# Patient Record
Sex: Male | Born: 1945 | Race: White | Hispanic: No | Marital: Married | Smoking: Former smoker
Health system: Southern US, Community
[De-identification: ages and names within clinical notes are randomized; demographics above are authoritative.]

## PROBLEM LIST (undated history)

## (undated) DIAGNOSIS — F32A Depression, unspecified: Secondary | ICD-10-CM

## (undated) DIAGNOSIS — E785 Hyperlipidemia, unspecified: Secondary | ICD-10-CM

## (undated) DIAGNOSIS — F329 Major depressive disorder, single episode, unspecified: Secondary | ICD-10-CM

## (undated) HISTORY — PX: CATARACT EXTRACTION: SUR2

## (undated) HISTORY — DX: Depression, unspecified: F32.A

## (undated) HISTORY — DX: Major depressive disorder, single episode, unspecified: F32.9

## (undated) HISTORY — DX: Hyperlipidemia, unspecified: E78.5

## (undated) HISTORY — PX: CHOLECYSTECTOMY: SHX55

---

## 2002-08-16 LAB — HM COLONOSCOPY

## 2011-11-30 ENCOUNTER — Ambulatory Visit: Payer: Self-pay

## 2011-11-30 LAB — AMYLASE: Amylase: 33 U/L (ref 25–115)

## 2011-11-30 LAB — COMPREHENSIVE METABOLIC PANEL
Albumin: 3.9 g/dL (ref 3.4–5.0)
Anion Gap: 10 (ref 7–16)
Bilirubin,Total: 0.5 mg/dL (ref 0.2–1.0)
Chloride: 102 mmol/L (ref 98–107)
Co2: 28 mmol/L (ref 21–32)
Creatinine: 0.8 mg/dL (ref 0.60–1.30)
EGFR (African American): >60
Osmolality: 282 (ref 275–301)
Potassium: 3.9 mmol/L (ref 3.5–5.1)
SGOT(AST): 21 U/L (ref 15–37)
SGPT (ALT): 22 U/L
Sodium: 140 mmol/L (ref 136–145)
Total Protein: 7.4 g/dL (ref 6.4–8.2)

## 2011-11-30 LAB — CBC WITH DIFFERENTIAL/PLATELET
Basophil #: 0 10*3/uL (ref 0.0–0.1)
Eosinophil %: 0.3 %
HCT: 46.9 % (ref 40.0–52.0)
Lymphocyte #: 1.6 10*3/uL (ref 1.0–3.6)
Lymphocyte %: 13.7 %
MCH: 33.5 pg (ref 26.0–34.0)
MCHC: 34.4 g/dL (ref 32.0–36.0)
MCV: 97 fL (ref 80–100)
Neutrophil #: 9.5 10*3/uL — ABNORMAL HIGH (ref 1.4–6.5)
Neutrophil %: 79.4 %
Platelet: 189 10*3/uL (ref 150–440)
RDW: 13.2 % (ref 11.5–14.5)

## 2011-11-30 LAB — LIPASE, BLOOD: Lipase: 110 U/L (ref 73–393)

## 2011-12-01 ENCOUNTER — Ambulatory Visit: Payer: Self-pay

## 2011-12-08 ENCOUNTER — Ambulatory Visit: Payer: Self-pay | Admitting: General Surgery

## 2011-12-12 LAB — PATHOLOGY REPORT

## 2014-08-26 LAB — LIPID PANEL
Cholesterol: 233 mg/dL — AB (ref 0–200)
HDL: 45 mg/dL (ref 35–70)
LDL CALC: 157 mg/dL
Triglycerides: 155 mg/dL (ref 40–160)

## 2014-08-26 LAB — PSA: PSA: 5

## 2014-08-26 LAB — BASIC METABOLIC PANEL
BUN: 11 mg/dL (ref 4–21)
Creatinine: 0.9 mg/dL (ref <=1.3)

## 2014-08-26 LAB — HEMOGLOBIN A1C: Hgb A1c MFr Bld: 6.1 % — AB (ref 4.0–6.0)

## 2014-12-07 NOTE — Op Note (Signed)
PATIENT NAME:  Charles Snyder, Charles Snyder MR#:  [id number] DATE OF BIRTH:  12-04-1945  DATE OF PROCEDURE:  12/08/2011  PREOPERATIVE DIAGNOSIS: Chronic cholecystitis and cholelithiasis.   POSTOPERATIVE DIAGNOSIS: Chronic cholecystitis and cholelithiasis.   OPERATIVE PROCEDURE: Laparoscopic cholecystectomy with intraoperative cholangiograms.   SURGEON: Hervey Ard, MD  ANESTHESIA: General endotracheal under Dr. [place] Service.   ESTIMATED BLOOD LOSS: 5 mL.   CLINICAL NOTE: This 69 year old male had an episode of severe upper abdominal pain approximately one week ago. Ultrasound showed evidence of acute cholecystitis. He was asymptomatic at that time and he was scheduled for elective cholecystectomy.   OPERATIVE NOTE: With the patient under adequate general endotracheal anesthesia, the abdomen was prepped with ChloraPrep and draped. In Trendelenburg position, a Veress needle was placed through a transumbilical incision. After assuring intraabdominal location with the hanging drop test, the abdomen was insufflated with CO2 at 10 mmHg pressure. A 10 mm step port was expanded and inspection showed no evidence of injury from initial port placement.   The patient was placed in reverse Trendelenburg position and rolled to the left. An 11 mm Xcel port was placed in the epigastrium and two 5 mm step ports placed laterally. The adhesions of the omentum to the undersurface of the gallbladder and liver were taken down with cautery dissection. The gallbladder was placed on cephalad traction. There were moderate inflammatory changes and an enlarged lymph node in the portal triad. The cystic duct was cleared and fluoroscopic cholangiograms completed using 13 mL of one-half strength Conray 60. This showed prompt filling of the right and left hepatic ducts, free flow into the common bile duct and into the duodenum. No evidence of retained stones. The cystic duct and multiple branches of the cystic artery were clipped  adjacent to the gallbladder. It was removed from the liver bed making use of hook cautery dissection. The gallbladder was placed into a retrieval bag and brought out through the umbilical port site. After reestablishing pneumoperitoneum, inspection from the epigastric site again showed no evidence of injury from initial port placement. The right upper quadrant was irrigated with lactated Ringer's solution. The abdomen was then desufflated and ports removed under direct vision. The fascia at the umbilicus was closed with a single 0 Vicryl suture. Skin incisions were closed with 4-0 Vicryl subcuticular sutures. Benzoin, Steri-Strips, Telfa, and Tegaderm dressing was then  applied.   The patient tolerated the procedure well and was taken to the recovery room in stable condition. ____________________________ Robert Bellow, MD jwb:slb D: 12/08/2011 21:52:24 ET T: 12/09/2011 10:15:52 ET JOB#: [id number]  cc: Robert Bellow, MD, <Dictator> [name], MD Daxx Tiggs Amedeo Kinsman MD ELECTRONICALLY SIGNED 12/11/2011 13:42

## 2015-03-03 ENCOUNTER — Other Ambulatory Visit: Payer: Self-pay | Admitting: Internal Medicine

## 2015-03-03 ENCOUNTER — Encounter: Payer: Self-pay | Admitting: Internal Medicine

## 2015-03-03 DIAGNOSIS — E119 Type 2 diabetes mellitus without complications: Secondary | ICD-10-CM | POA: Insufficient documentation

## 2015-03-03 DIAGNOSIS — F17201 Nicotine dependence, unspecified, in remission: Secondary | ICD-10-CM | POA: Insufficient documentation

## 2015-03-03 DIAGNOSIS — G2581 Restless legs syndrome: Secondary | ICD-10-CM | POA: Insufficient documentation

## 2015-03-03 DIAGNOSIS — E785 Hyperlipidemia, unspecified: Secondary | ICD-10-CM | POA: Insufficient documentation

## 2015-03-03 DIAGNOSIS — Z86010 Personal history of colonic polyps: Secondary | ICD-10-CM | POA: Insufficient documentation

## 2015-06-15 ENCOUNTER — Ambulatory Visit (INDEPENDENT_AMBULATORY_CARE_PROVIDER_SITE_OTHER): Payer: Medicare HMO

## 2015-06-15 DIAGNOSIS — Z23 Encounter for immunization: Secondary | ICD-10-CM | POA: Diagnosis not present

## 2015-07-18 ENCOUNTER — Other Ambulatory Visit: Payer: Self-pay | Admitting: Internal Medicine

## 2015-09-09 ENCOUNTER — Other Ambulatory Visit: Payer: Self-pay | Admitting: Internal Medicine

## 2015-10-08 ENCOUNTER — Encounter: Payer: Self-pay | Admitting: Internal Medicine

## 2015-10-08 ENCOUNTER — Ambulatory Visit (INDEPENDENT_AMBULATORY_CARE_PROVIDER_SITE_OTHER): Payer: Medicare HMO | Admitting: Internal Medicine

## 2015-10-08 VITALS — BP 118/80 | HR 64 | Ht 74.0 in | Wt 213.2 lb

## 2015-10-08 DIAGNOSIS — R7303 Prediabetes: Secondary | ICD-10-CM

## 2015-10-08 DIAGNOSIS — Z720 Tobacco use: Secondary | ICD-10-CM | POA: Diagnosis not present

## 2015-10-08 DIAGNOSIS — G2581 Restless legs syndrome: Secondary | ICD-10-CM | POA: Diagnosis not present

## 2015-10-08 DIAGNOSIS — R972 Elevated prostate specific antigen [PSA]: Secondary | ICD-10-CM

## 2015-10-08 DIAGNOSIS — Z1211 Encounter for screening for malignant neoplasm of colon: Secondary | ICD-10-CM | POA: Diagnosis not present

## 2015-10-08 DIAGNOSIS — E785 Hyperlipidemia, unspecified: Secondary | ICD-10-CM | POA: Diagnosis not present

## 2015-10-08 DIAGNOSIS — F17201 Nicotine dependence, unspecified, in remission: Secondary | ICD-10-CM

## 2015-10-08 LAB — POCT URINALYSIS DIPSTICK
BILIRUBIN UA: NEGATIVE
GLUCOSE UA: NEGATIVE
Ketones, UA: NEGATIVE
LEUKOCYTES UA: NEGATIVE
NITRITE UA: NEGATIVE
Protein, UA: NEGATIVE
RBC UA: NEGATIVE
Spec Grav, UA: 1.01
UROBILINOGEN UA: 0.2
pH, UA: 6

## 2015-10-08 MED ORDER — CARBIDOPA-LEVODOPA 25-250 MG PO TABS: 5.0000 | ORAL_TABLET | Freq: Every day | ORAL | Status: DC

## 2015-10-08 NOTE — Patient Instructions (Signed)
Health Maintenance  Topic Date Due  . Hepatitis C Screening  18-May-1946  . TETANUS/TDAP  04/25/1967  . ZOSTAVAX  04/24/2008  . COLONOSCOPY  08/16/2012  . INFLUENZA VACCINE  03/15/2016  . PNA vac Low Risk Adult  Completed

## 2015-10-08 NOTE — Progress Notes (Signed)
Patient: Charles Snyder, Male    DOB: 1946/01/31, 70 y.o.   MRN: [id number] Visit Date: 10/08/2015  Today's Provider: [name], MD   Chief Complaint  Patient presents with  . Medicare Wellness  . Hyperlipidemia   Subjective:    Annual wellness visit Charles Snyder is a 70 y.o. male who presents today for his Subsequent Annual Wellness Visit. He feels fairly well. He reports exercising regularly. He reports he is sleeping fairly well.   ----------------------------------------------------------- Hyperlipidemia This is a chronic problem. The current episode started more than 1 year ago. Pertinent negatives include no chest pain, myalgias or shortness of breath. Current antihyperlipidemic treatment includes exercise and diet change (stopped lipitor due to worsening RLS). Improvement on treatment: he also quit smoking.  Restless leg - has been on sinemet for many years for severe symptoms.  They seem to be progressing lately.  Gabapentin is also used to help extend the effect but he has been reducing it recently. Elevated PSA - seen by Urology.  Biopsy was negative.  He was due to return for a PSA recheck several months ago but never got the call back. Lab Results  Component Value Date   PSA 5.0 08/26/2014    Review of Systems  Constitutional: Negative for chills, diaphoresis, appetite change, fatigue and unexpected weight change.  HENT: Negative for hearing loss, tinnitus, trouble swallowing and voice change.   Eyes: Negative for visual disturbance.  Respiratory: Negative for choking, shortness of breath and wheezing.   Cardiovascular: Negative for chest pain, palpitations and leg swelling.  Gastrointestinal: Negative for abdominal pain, diarrhea, constipation and blood in stool.  Genitourinary: Negative for dysuria, frequency, decreased urine volume and difficulty urinating.  Musculoskeletal: Positive for arthralgias. Negative for myalgias and back pain.  Skin: Negative  for color change and rash.  Neurological: Negative for dizziness, syncope, light-headedness and headaches.  Hematological: Negative for adenopathy.  Psychiatric/Behavioral: Negative for sleep disturbance and dysphoric mood.    Social History   Social History  . Marital Status: Married    Spouse Name: N/A  . Number of Children: N/A  . Years of Education: N/A   Occupational History  . Not on file.   Social History Main Topics  . Smoking status: Former Smoker    Quit date: 12/14/2014  . Smokeless tobacco: Not on file  . Alcohol Use: 0.0 oz/week    0 Standard drinks or equivalent per week     Comment: beer or wine occasionally  . Drug Use: No  . Sexual Activity: Not on file   Other Topics Concern  . Not on file   Social History Narrative    Patient Active Problem List   Diagnosis Date Noted  . Elevated PSA 10/08/2015  . Restless leg 03/03/2015  . Current tobacco use 03/03/2015  . Dyslipidemia 03/03/2015  . History of colon polyps 03/03/2015  . Pre-diabetes 03/03/2015    Past Surgical History  Procedure Laterality Date  . Cholecystectomy    . Cataracts      His family history includes Diabetes in his mother; Lung cancer in his father.    Previous Medications   ATORVASTATIN (LIPITOR) 20 MG TABLET    TAKE 1 TABLET BY MOUTH AT BEDTIME   CARBIDOPA-LEVODOPA (SINEMET IR) 25-250 MG TABLET    TAKE 5 TABLETS BY MOUTH DAILY   GABAPENTIN (NEURONTIN) 300 MG CAPSULE    TAKE 2-4 CAPSULES BY MOUTH EVERY NIGHT AT BEDTIME    Patient Care Team: Glean Hess,  MD as PCP - General (Family Medicine)     Objective:   Vitals: BP 118/80 mmHg  Pulse 64  Ht 6\' 2"  (1.88 m)  Wt 213 lb 3.2 oz (96.707 kg)  BMI 27.36 kg/m2  Physical Exam  Constitutional: He is oriented to person, place, and time. He appears well-developed and well-nourished.  HENT:  Head: Normocephalic.  Right Ear: Tympanic membrane, external ear and ear canal normal.  Left Ear: Tympanic membrane, external  ear and ear canal normal.  Nose: Nose normal.  Mouth/Throat: Uvula is midline and oropharynx is clear and moist.  Eyes: Conjunctivae and EOM are normal. Pupils are equal, round, and reactive to light.  Neck: Normal range of motion. Neck supple. Carotid bruit is not present. No thyromegaly present.  Cardiovascular: Normal rate, regular rhythm, normal heart sounds and intact distal pulses.   Pulmonary/Chest: Effort normal and breath sounds normal. He has no wheezes. Right breast exhibits no mass. Left breast exhibits no mass.  Abdominal: Soft. Normal appearance and bowel sounds are normal. There is no hepatosplenomegaly. There is no tenderness.  Musculoskeletal: Normal range of motion.  Lymphadenopathy:    He has no cervical adenopathy.  Neurological: He is alert and oriented to person, place, and time. He has normal reflexes.  Skin: Skin is warm, dry and intact.  Psychiatric: He has a normal mood and affect. His speech is normal and behavior is normal. Judgment and thought content normal.  Nursing note and vitals reviewed.   Activities of Daily Living In your present state of health, do you have any difficulty performing the following activities: 10/08/2015  Hearing? N  Vision? N  Difficulty concentrating or making decisions? N  Walking or climbing stairs? N  Dressing or bathing? N  Doing errands, shopping? N    Fall Risk Assessment Fall Risk  10/08/2015  Falls in the past year? No      Depression Screen PHQ 2/9 Scores 10/08/2015  PHQ - 2 Score 0    Cognitive Testing - 6-CIT   Correct? Score   What year is it? yes 0 Yes = 0    No = 4  What month is it? yes 0 Yes = 0    No = 3  Remember:     [address]ia [address], [place], [place]     What time is it? yes 0 Yes = 0    No = 3  Count backwards from 20 to 1 yes 0 Correct = 0    1 error = 2   More than 1 error = 4  Say the months of the year in reverse. yes 0 Correct = 0    1 error = 2   More than 1 error = 4  What address did I ask  you to remember? yes 0 Correct = 0  1 error = 2    2 error = 4    3 error = 6    4 error = 8    All wrong = 10       TOTAL SCORE  0/28   Interpretation:  Normal  Normal (0-7) Abnormal (8-28)     Medicare Annual Wellness Visit Summary:  Reviewed patient's Family Medical History Reviewed and updated list of patient's medical providers Assessment of cognitive impairment was done Assessed patient's functional ability Established a written schedule for health screening [place] Completed and Reviewed  Exercise Activities and Dietary recommendations Goals    None  Immunization History  Administered Date(s) Administered  . Influenza,inj,Quad PF,36+ Mos 06/15/2015  . Pneumococcal Conjugate-13 08/26/2014  . Pneumococcal Polysaccharide-23 05/01/2013    Health Maintenance  Topic Date Due  . Hepatitis C Screening  03-18-1946  . TETANUS/TDAP  04/25/1967  . ZOSTAVAX  04/24/2008  . COLONOSCOPY  08/16/2012  . INFLUENZA VACCINE  03/15/2016  . PNA vac Low Risk Adult  Completed     Discussed health benefits of physical activity, and encouraged him to engage in regular exercise appropriate for his age and condition.    ------------------------------------------------------------------------------------------------------------   Assessment & Plan:  1. Dyslipidemia Now off of statins but has quit smoking - Lipid panel  2. Pre-diabetes Continue exercise and diet - Comprehensive metabolic panel - Hemoglobin A1c - POCT Urinalysis Dipstick  3. Restless leg Take gabapentin regularly May need Neurology re-evaluation - CBC with Differential/Platelet - TSH - carbidopa-levodopa (SINEMET IR) 25-250 MG tablet; Take 5 tablets by mouth daily.  Dispense: 150 tablet; Refill: 5  4. Tobacco use disorder, moderate, in sustained remission Pt congratulated on quitting  5. Elevated PSA Will recheck today - PSA  6. Colon cancer screening Refer for 10 yr follow  up. - Ambulatory referral to Gastroenterology   [name], MD [facility]  10/08/2015

## 2015-10-09 ENCOUNTER — Telehealth: Payer: Self-pay

## 2015-10-09 LAB — COMPREHENSIVE METABOLIC PANEL
A/G RATIO: 1.6 (ref 1.1–2.5)
ALK PHOS: 89 IU/L (ref 39–117)
ALT: 32 IU/L (ref 0–44)
AST: 34 IU/L (ref 0–40)
Albumin: 4.4 g/dL (ref 3.6–4.8)
BUN/Creatinine Ratio: 14 (ref 10–22)
BUN: 13 mg/dL (ref 8–27)
Bilirubin Total: 0.6 mg/dL (ref 0.0–1.2)
CO2: 25 mmol/L (ref 18–29)
CREATININE: 0.9 mg/dL (ref 0.76–1.27)
Calcium: 9.7 mg/dL (ref 8.6–10.2)
Chloride: 100 mmol/L (ref 96–106)
GFR calc Af Amer: 102 mL/min/{1.73_m2} (ref >59)
GFR calc non Af Amer: 88 mL/min/{1.73_m2} (ref >59)
Globulin, Total: 2.8 g/dL (ref 1.5–4.5)
Glucose: 101 mg/dL — ABNORMAL HIGH (ref 65–99)
POTASSIUM: 4.4 mmol/L (ref 3.5–5.2)
Sodium: 139 mmol/L (ref 134–144)
Total Protein: 7.2 g/dL (ref 6.0–8.5)

## 2015-10-09 LAB — CBC WITH DIFFERENTIAL/PLATELET
Basophils Absolute: 0 10*3/uL (ref 0.0–0.2)
Basos: 0 %
EOS (ABSOLUTE): 0.1 10*3/uL (ref 0.0–0.4)
Eos: 2 %
Hematocrit: 46.9 % (ref 37.5–51.0)
Hemoglobin: 15.8 g/dL (ref 12.6–17.7)
Immature Grans (Abs): 0 10*3/uL (ref 0.0–0.1)
Immature Granulocytes: 0 %
Lymphocytes Absolute: 1.9 10*3/uL (ref 0.7–3.1)
Lymphs: 36 %
MCH: 32.1 pg (ref 26.6–33.0)
MCHC: 33.7 g/dL (ref 31.5–35.7)
MCV: 95 fL (ref 79–97)
MONOS ABS: 0.6 10*3/uL (ref 0.1–0.9)
Monocytes: 12 %
NEUTROS PCT: 50 %
Neutrophils Absolute: 2.7 10*3/uL (ref 1.4–7.0)
PLATELETS: 221 10*3/uL (ref 150–379)
RBC: 4.92 x10E6/uL (ref 4.14–5.80)
RDW: 13.8 % (ref 12.3–15.4)
WBC: 5.3 10*3/uL (ref 3.4–10.8)

## 2015-10-09 LAB — LIPID PANEL
CHOLESTEROL TOTAL: 229 mg/dL — AB (ref 100–199)
Chol/HDL Ratio: 5.1 ratio units — ABNORMAL HIGH (ref 0.0–5.0)
HDL: 45 mg/dL (ref >39)
LDL Calculated: 155 mg/dL — ABNORMAL HIGH (ref 0–99)
TRIGLYCERIDES: 144 mg/dL (ref 0–149)
VLDL Cholesterol Cal: 29 mg/dL (ref 5–40)

## 2015-10-09 LAB — TSH: TSH: 1.17 u[IU]/mL (ref 0.450–4.500)

## 2015-10-09 LAB — HEMOGLOBIN A1C
ESTIMATED AVERAGE GLUCOSE: 137 mg/dL
Hgb A1c MFr Bld: 6.4 % — ABNORMAL HIGH (ref 4.8–5.6)

## 2015-10-09 LAB — PSA: Prostate Specific Ag, Serum: 5 ng/mL — ABNORMAL HIGH (ref 0.0–4.0)

## 2015-10-09 NOTE — Telephone Encounter (Signed)
Spoke with patient. Patient advised of all results and verbalized understanding. Will call back with any future questions or concerns. MAH  

## 2015-10-09 NOTE — Telephone Encounter (Signed)
-----   Message from Glean Hess, MD sent at 10/09/2015  7:54 AM EST ----- PSA is same at 5.0.  Pre-diabetes is slightly worse - work on diet and weight loss.  Need to recheck with OV and lab in 6 months.  All other labs are good.  Cholesterol is unchanged.

## 2015-11-02 ENCOUNTER — Telehealth: Payer: Self-pay | Admitting: Gastroenterology

## 2015-11-02 NOTE — Telephone Encounter (Signed)
Responding to colonoscopy letter

## 2015-11-05 NOTE — Telephone Encounter (Signed)
LVM for pt to return my call.

## 2015-11-24 ENCOUNTER — Telehealth: Payer: Self-pay | Admitting: Gastroenterology

## 2015-11-24 NOTE — Telephone Encounter (Signed)
Please call for colonoscopy screening. There is a referral from Dr Army Melia and I looked in his chart and Ginger attempted to call patient for triage in March and also mailed a letter. Patient called on 11/24/15 ready to schedule colonoscopy.

## 2015-12-07 ENCOUNTER — Encounter: Payer: Self-pay | Admitting: Internal Medicine

## 2015-12-07 ENCOUNTER — Ambulatory Visit
Admission: RE | Admit: 2015-12-07 | Discharge: 2015-12-07 | Disposition: A | Discharge status: Home / Self Care | Payer: Medicare HMO | Source: Ambulatory Visit | Attending: Internal Medicine | Admitting: Internal Medicine

## 2015-12-07 ENCOUNTER — Ambulatory Visit (INDEPENDENT_AMBULATORY_CARE_PROVIDER_SITE_OTHER): Payer: Medicare HMO | Admitting: Internal Medicine

## 2015-12-07 VITALS — BP 132/84 | HR 68 | Ht 74.0 in | Wt 216.0 lb

## 2015-12-07 DIAGNOSIS — I700 Atherosclerosis of aorta: Secondary | ICD-10-CM | POA: Insufficient documentation

## 2015-12-07 DIAGNOSIS — R251 Tremor, unspecified: Secondary | ICD-10-CM | POA: Diagnosis not present

## 2015-12-07 DIAGNOSIS — R202 Paresthesia of skin: Secondary | ICD-10-CM | POA: Insufficient documentation

## 2015-12-07 DIAGNOSIS — G2581 Restless legs syndrome: Secondary | ICD-10-CM

## 2015-12-07 DIAGNOSIS — E119 Type 2 diabetes mellitus without complications: Secondary | ICD-10-CM | POA: Diagnosis not present

## 2015-12-07 DIAGNOSIS — Z9189 Other specified personal risk factors, not elsewhere classified: Secondary | ICD-10-CM

## 2015-12-07 DIAGNOSIS — Z77098 Contact with and (suspected) exposure to other hazardous, chiefly nonmedicinal, chemicals: Secondary | ICD-10-CM

## 2015-12-07 NOTE — Progress Notes (Signed)
Date:  12/07/2015   Name:  Charles Snyder   DOB:  1945-11-25   MRN:  [id number]   Chief Complaint: Follow-up and Diabetes Diabetes He presents for his follow-up diabetic visit. He has type 2 diabetes mellitus. His disease course has been stable. Hypoglycemia symptoms include tremors. Pertinent negatives for hypoglycemia include no dizziness or headaches. Pertinent negatives for diabetes include no chest pain, no fatigue and no weakness.  Last A1C = 6.4.   He is following a good diet and controlling his weight.  We have not started medication yet.  RLS - onset many years ago.  Now having more trouble with fine motor movements in hands/arms and feels like his muscles are twitching or he has a  tremor.   Stiffness in neck and arms -  Worsening over the past year. Having more trouble with neck movements.  No weakness in arms.  Not taking any medication or using heat or ice.  He feels like there is a tremor. Since he takes Sinemet for RLS he wonders if parkinson symptoms are being masked.  Tingling/numbness in feet - off and on for several years and now increasing.  Persistent and worse with prolonged walking or standing.  He denies change in bowel or bladder function.  He denies leg weakness. He has never had evaluation of his lower back for DDD.   He reports being exposed to [place] in [place].  He never thought much about possible medical complications until his recent DM and worsening neurological symptoms. He is investigating what his options are with the [place].  Review of Systems  Constitutional: Negative for fever, chills, fatigue and unexpected weight change.  HENT: Negative for tinnitus and trouble swallowing.   Eyes: Negative for visual disturbance.  Respiratory: Negative for cough, chest tightness, shortness of breath and wheezing.   Cardiovascular: Negative for chest pain, palpitations and leg swelling.  Gastrointestinal: Negative for abdominal pain.  Genitourinary: Negative  for dysuria.  Musculoskeletal: Positive for myalgias, arthralgias and neck stiffness. Negative for gait problem and neck pain.  Skin: Negative for rash.  Neurological: Positive for tremors. Negative for dizziness, syncope, weakness and headaches.  Psychiatric/Behavioral: Positive for sleep disturbance (and vivid dreams). Negative for dysphoric mood.    Patient Active Problem List   Diagnosis Date Noted  . Elevated PSA 10/08/2015  . Restless leg 03/03/2015  . Tobacco use disorder, moderate, in sustained remission 03/03/2015  . Dyslipidemia 03/03/2015  . History of colon polyps 03/03/2015  . Pre-diabetes 03/03/2015    Prior to Admission medications   Medication Sig Start Date End Date Taking? Authorizing Provider  carbidopa-levodopa (SINEMET IR) 25-250 MG tablet Take 5 tablets by mouth daily. 10/08/15  Yes Glean Hess, MD  gabapentin (NEURONTIN) 300 MG capsule TAKE 2-4 CAPSULES BY MOUTH EVERY NIGHT AT BEDTIME 09/09/15  Yes Glean Hess, MD  atorvastatin (LIPITOR) 20 MG tablet TAKE 1 TABLET BY MOUTH AT BEDTIME Patient not taking: Reported on 12/07/2015 03/03/15   Glean Hess, MD    No Known Allergies  Past Surgical History  Procedure Laterality Date  . Cholecystectomy    . Cataract extraction Bilateral     Social History  Substance Use Topics  . Smoking status: Former Smoker    Quit date: 12/14/2014  . Smokeless tobacco: None  . Alcohol Use: 0.0 oz/week    0 Standard drinks or equivalent per week     Comment: beer or wine occasionally     Medication list has  been reviewed and updated.   Physical Exam  Constitutional: He is oriented to person, place, and time. He appears well-developed and well-nourished. No distress.  HENT:  Head: Normocephalic and atraumatic.  Neck: Muscular tenderness present. Carotid bruit is not present. Decreased range of motion present.  Cardiovascular: Normal rate, regular rhythm and normal heart sounds.   Pulmonary/Chest: Effort  normal and breath sounds normal. No respiratory distress. He has no wheezes. He has no rhonchi.  Musculoskeletal:       Cervical back: He exhibits decreased range of motion and bony tenderness. He exhibits no edema and no spasm.       Lumbar back: He exhibits no tenderness and no spasm.  Neurological: He is alert and oriented to person, place, and time. He displays no tremor. He exhibits abnormal muscle tone (increased tone in UE to passive ROM - unsure how much is due to inability to completely relax during the exam). Coordination and gait normal.  Reflex Scores:      Bicep reflexes are 2+ on the right side and 2+ on the left side.      Patellar reflexes are 1+ on the right side and 1+ on the left side. Skin: Skin is warm and dry. No rash noted.  Psychiatric: He has a normal mood and affect. His speech is normal and behavior is normal. Thought content normal.    BP 132/84 mmHg  Pulse 68  Ht 6\' 2"  (1.88 m)  Wt 216 lb (97.977 kg)  BMI 27.72 kg/m2  Assessment and Plan: 1. Type 2 diabetes mellitus without complication, without long-term current use of insulin (HCC) Now DM range A1C 6.4 Continue diet and exercise - recheck in 2 months to determine need for medication  2. Restless leg Continue [place] - Ambulatory referral to Neurology  3. 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 Paresthesia of both feet Rule out lumbar DDD Consider further testing if needed - DG Lumbar Spine Complete; Future - Ambulatory referral to Neurology  5. History of agent Orange exposure DM and neurological issues may be related - patient is determining next course of action.  [name], MD [facility]  12/07/2015

## 2015-12-08 ENCOUNTER — Telehealth: Payer: Self-pay

## 2015-12-08 NOTE — Telephone Encounter (Signed)
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  No acute abnormality and no obvious cause of nerve compression to cause foot symptoms.  Will wait for Neurology evaluation then consider further testing of the lumbar spine if needed.

## 2015-12-08 NOTE — Telephone Encounter (Signed)
Spoke with patient. Patient advised of all results and verbalized understanding. Will call back with any future questions or concerns. MAH  

## 2015-12-30 NOTE — Telephone Encounter (Signed)
LVM again for pt to return my call.  

## 2016-02-08 ENCOUNTER — Encounter: Payer: Self-pay | Admitting: Internal Medicine

## 2016-02-08 ENCOUNTER — Ambulatory Visit (INDEPENDENT_AMBULATORY_CARE_PROVIDER_SITE_OTHER): Payer: Medicare HMO | Admitting: Internal Medicine

## 2016-02-08 VITALS — BP 120/88 | HR 86 | Ht 70.0 in | Wt 210.0 lb

## 2016-02-08 DIAGNOSIS — E119 Type 2 diabetes mellitus without complications: Secondary | ICD-10-CM

## 2016-02-08 DIAGNOSIS — E785 Hyperlipidemia, unspecified: Secondary | ICD-10-CM

## 2016-02-08 DIAGNOSIS — G2581 Restless legs syndrome: Secondary | ICD-10-CM | POA: Diagnosis not present

## 2016-02-08 MED ORDER — PRAVASTATIN SODIUM 20 MG PO TABS: 20.0000 mg | ORAL_TABLET | Freq: Every day | ORAL | Status: AC

## 2016-02-08 MED ORDER — GABAPENTIN 300 MG PO CAPS: ORAL_CAPSULE | ORAL | Status: DC

## 2016-02-08 MED ORDER — CARBIDOPA-LEVODOPA 25-250 MG PO TABS: 5.0000 | ORAL_TABLET | Freq: Every day | ORAL | Status: DC

## 2016-02-08 NOTE — Progress Notes (Signed)
Date:  02/08/2016   Name:  Charles Snyder   DOB:  October 19, 1945   MRN:  [id number]   Chief Complaint: Diabetes Diabetes He presents for his follow-up diabetic visit. He has type 2 diabetes mellitus. His disease course has been stable (A1C slightly worse). Pertinent negatives for hypoglycemia include no headaches, mood changes or sweats. Pertinent negatives for diabetes include no blurred vision, no chest pain, no fatigue, no foot ulcerations and no polyuria. Symptoms are stable.  Hyperlipidemia This is a chronic problem. The current episode started more than 1 year ago. The problem is uncontrolled (could not tolerate lipitor due to muscle cramps). Exacerbating diseases include diabetes. Pertinent negatives include no chest pain or shortness of breath. Compliance problems include medication side effects.   Restless leg - recently seen by Neurology and placed on Celexa 40 mg.  He has no side effects but also has not noticed any improvement in his neurological symptoms.  He also had a NCS/EMG.  Lab Results  Component Value Date   HGBA1C 6.4* 10/08/2015     Review of Systems  Constitutional: Negative for fever, chills and fatigue.  Eyes: Negative for blurred vision.  Respiratory: Negative for cough, chest tightness and shortness of breath.   Cardiovascular: Negative for chest pain, palpitations and leg swelling.  Endocrine: Negative for polyuria.  Musculoskeletal: Negative for arthralgias.  Neurological: Positive for numbness (and muscle twitching). Negative for headaches.  Hematological: Negative for adenopathy.  Psychiatric/Behavioral: Negative for dysphoric mood.    Patient Active Problem List   Diagnosis Date Noted  . History of agent Orange exposure 12/07/2015  . Tremor of both hands 12/07/2015  . Paresthesia of both feet 12/07/2015  . Elevated PSA 10/08/2015  . Restless leg 03/03/2015  . Tobacco use disorder, moderate, in sustained remission 03/03/2015  . Dyslipidemia  03/03/2015  . History of colon polyps 03/03/2015  . Diabetes ([place]) 03/03/2015    Prior to Admission medications   Medication Sig Start Date End Date Taking? Authorizing Provider  citalopram (CELEXA) 40 MG tablet Take 1 tablet by mouth daily. 12/31/15  Yes Historical Provider, MD  atorvastatin (LIPITOR) 20 MG tablet TAKE 1 TABLET BY MOUTH AT BEDTIME Patient not taking: Reported on 12/07/2015 03/03/15   Glean Hess, MD  carbidopa-levodopa (SINEMET IR) 25-250 MG tablet Take 5 tablets by mouth daily. 10/08/15   Glean Hess, MD  gabapentin (NEURONTIN) 300 MG capsule TAKE 2-4 CAPSULES BY MOUTH EVERY NIGHT AT BEDTIME 09/09/15   Glean Hess, MD    No Known Allergies  Past Surgical History  Procedure Laterality Date  . Cholecystectomy    . Cataract extraction Bilateral     Social History  Substance Use Topics  . Smoking status: Former Smoker    Quit date: 12/14/2014  . Smokeless tobacco: None  . Alcohol Use: 0.0 oz/week    0 Standard drinks or equivalent per week     Comment: beer or wine occasionally     Medication list has been reviewed and updated.   Physical Exam  Constitutional: He is oriented to person, place, and time. He appears well-developed. No distress.  HENT:  Head: Normocephalic and atraumatic.  Neck: Carotid bruit is not present.  Cardiovascular: Normal rate, regular rhythm and normal heart sounds.   Pulmonary/Chest: Effort normal and breath sounds normal. No respiratory distress.  Musculoskeletal: Normal range of motion.  Neurological: He is alert and oriented to person, place, and time.  Normal foot exam bilaterally  Skin: Skin is  warm and dry. No rash noted.  Psychiatric: He has a normal mood and affect. His speech is normal and behavior is normal. Thought content normal.  Nursing note and vitals reviewed.   BP 120/88 mmHg  Pulse 86  Ht 5\' 10"  (1.778 m)  Wt 210 lb (95.255 kg)  BMI 30.13 kg/m2  Assessment and Plan: 1. Type 2 diabetes  mellitus without complication, without long-term current use of insulin (HCC) Continue diet and exercise - Hemoglobin A1c  2. Dyslipidemia Will try Pravachol - pravastatin (PRAVACHOL) 20 MG tablet; Take 1 tablet (20 mg total) by mouth daily.  Dispense: 90 tablet; Refill: 3  3. Restless leg Seen by Neurology Continue celexa - gabapentin (NEURONTIN) 300 MG capsule; TAKE 2-4 CAPSULES BY MOUTH EVERY NIGHT AT BEDTIME  Dispense: 100 capsule; Refill: 5 - carbidopa-levodopa (SINEMET IR) 25-250 MG tablet; Take 5 tablets by mouth daily.  Dispense: 150 tablet; Refill: [place], MD [facility]  02/08/2016

## 2016-02-09 LAB — HEMOGLOBIN A1C
Est. average glucose Bld gHb Est-mCnc: 131 mg/dL
Hgb A1c MFr Bld: 6.2 % — ABNORMAL HIGH (ref 4.8–5.6)

## 2016-03-03 ENCOUNTER — Other Ambulatory Visit: Payer: Self-pay | Admitting: Internal Medicine

## 2016-06-09 ENCOUNTER — Ambulatory Visit: Payer: Medicare HMO | Admitting: Internal Medicine

## 2016-06-10 ENCOUNTER — Other Ambulatory Visit: Payer: Self-pay | Admitting: Internal Medicine

## 2016-06-10 ENCOUNTER — Encounter (INDEPENDENT_AMBULATORY_CARE_PROVIDER_SITE_OTHER): Payer: Self-pay

## 2016-06-10 ENCOUNTER — Ambulatory Visit (INDEPENDENT_AMBULATORY_CARE_PROVIDER_SITE_OTHER): Payer: Medicare HMO | Admitting: Internal Medicine

## 2016-06-10 ENCOUNTER — Encounter: Payer: Self-pay | Admitting: Internal Medicine

## 2016-06-10 VITALS — BP 102/72 | HR 72 | Resp 16 | Ht 70.0 in | Wt 207.0 lb

## 2016-06-10 DIAGNOSIS — Z23 Encounter for immunization: Secondary | ICD-10-CM

## 2016-06-10 DIAGNOSIS — G2581 Restless legs syndrome: Secondary | ICD-10-CM

## 2016-06-10 DIAGNOSIS — R202 Paresthesia of skin: Secondary | ICD-10-CM | POA: Diagnosis not present

## 2016-06-10 DIAGNOSIS — E119 Type 2 diabetes mellitus without complications: Secondary | ICD-10-CM

## 2016-06-10 MED ORDER — CARBIDOPA-LEVODOPA 25-250 MG PO TABS: 5.0000 | ORAL_TABLET | Freq: Every day | ORAL | 5 refills | Status: DC

## 2016-06-10 NOTE — Progress Notes (Signed)
Date:  06/10/2016   Name:  Charles Snyder   DOB:  Feb 26, 1946   MRN:  [id number]   Chief Complaint: Diabetes (4 mo  fu) Diabetes  He presents for his follow-up diabetic visit. He has type 2 diabetes mellitus. Pertinent negatives for hypoglycemia include no headaches or tremors. Associated symptoms include foot paresthesias. Pertinent negatives for diabetes include no blurred vision, no chest pain, no fatigue, no foot ulcerations, no polydipsia, no polyuria and no weight loss. There are no hypoglycemic complications. Symptoms are stable. Current diabetic treatment includes diet. He is compliant with treatment most of the time.   Restless leg syndrome - on Sinemet 5 tabs spaced throughout the evening.  Symptoms are more prolonged now - even lasting into the morning.  He does not have sx during the day in general.  Neuropathy - pins and needles in feet seem to be getting worse.  He is already taking gabapentin for RLS. He denies any foot lesions or blisters.  Lightheadedness - occurs especially after a long round of golf.  He feels better after a snack and rest but still 'washed out' for the rest of the day.  He consumes a diet coke and a 16 oz bottle of water only while golfing for 4 hours.  Lab Results  Component Value Date   HGBA1C 6.2 (H) 02/08/2016     Review of Systems  Constitutional: Negative for appetite change, fatigue, unexpected weight change and weight loss.  HENT: Positive for hearing loss.   Eyes: Negative for blurred vision and visual disturbance.  Respiratory: Negative for cough, shortness of breath and wheezing.   Cardiovascular: Negative for chest pain, palpitations and leg swelling.  Gastrointestinal: Negative for abdominal pain and blood in stool.  Endocrine: Negative for polydipsia and polyuria.  Genitourinary: Negative for dysuria and hematuria.  Skin: Negative for color change and rash.  Neurological: Positive for light-headedness (after playing golf or other  exertion) and numbness. Negative for tremors, syncope and headaches.  Hematological: Negative for adenopathy.  Psychiatric/Behavioral: Negative for dysphoric mood and sleep disturbance.    Patient Active Problem List   Diagnosis Date Noted  . History of agent Orange exposure 12/07/2015  . Tremor of both hands 12/07/2015  . Paresthesia of both feet 12/07/2015  . Elevated PSA 10/08/2015  . Restless leg 03/03/2015  . Tobacco use disorder, moderate, in sustained remission 03/03/2015  . Dyslipidemia 03/03/2015  . History of colon polyps 03/03/2015  . Diabetes ([place]) 03/03/2015    Prior to Admission medications   Medication Sig Start Date End Date Taking? Authorizing Provider  carbidopa-levodopa (SINEMET IR) 25-250 MG tablet Take 5 tablets by mouth daily. 02/08/16  Yes Glean Hess, MD  gabapentin (NEURONTIN) 300 MG capsule TAKE 2-4 CAPSULES BY MOUTH EVERY NIGHT AT BEDTIME 02/08/16  Yes Glean Hess, MD  pravastatin (PRAVACHOL) 20 MG tablet Take 1 tablet (20 mg total) by mouth daily. 02/08/16  Yes Glean Hess, MD    No Known Allergies  Past Surgical History:  Procedure Laterality Date  . CATARACT EXTRACTION Bilateral   . CHOLECYSTECTOMY      Social History  Substance Use Topics  . Smoking status: Former Smoker    Quit date: 12/14/2014  . Smokeless tobacco: Never Used  . Alcohol use 0.0 oz/week     Comment: beer or wine occasionally     Medication list has been reviewed and updated.   Physical Exam  Constitutional: He is oriented to person, place, and time.  He appears well-developed and well-nourished. No distress.  HENT:  Head: Normocephalic and atraumatic.  Neck: Normal range of motion. Neck supple. Carotid bruit is not present.  Cardiovascular: Normal rate, regular rhythm and normal heart sounds.   Pulmonary/Chest: Effort normal and breath sounds normal. No respiratory distress.  Musculoskeletal: Normal range of motion.  Neurological: He is alert and oriented  to person, place, and time. He has normal reflexes.  Skin: Skin is warm and dry. No rash noted.  Psychiatric: He has a normal mood and affect. His behavior is normal. Thought content normal.  Nursing note and vitals reviewed.   BP 122/82   Pulse 72   Resp 16   Ht 5\' 10"  (1.778 m)   Wt 207 lb (93.9 kg)   SpO2 97%   BMI 29.70 kg/m   Assessment and Plan: 1. Type 2 diabetes mellitus without complication, without long-term current use of insulin (HCC) Diet controlled Increase fluid intake - Microalbumin / creatinine urine ratio - Hemoglobin A1c  2. Restless leg Continue current therapy - carbidopa-levodopa (SINEMET IR) 25-250 MG tablet; Take 5 tablets by mouth daily.  Dispense: 150 tablet; Refill: 5  3. Paresthesia of both feet Try Lyrica in place of gabapentin 75 mg twice a day (5 PM and 10 PM)  4. Need for influenza vaccination - Flu Vaccine QUAD 36+ mos IM   [name], MD [facility]  06/10/2016

## 2016-06-10 NOTE — Patient Instructions (Signed)
Lyrica 75 mg - take one at 5 PM and one an hour before bedtime.

## 2016-06-11 LAB — MICROALBUMIN / CREATININE URINE RATIO
Creatinine, Urine: 67.7 mg/dL
Microalbumin, Urine: <3 ug/mL

## 2016-06-28 LAB — HEMOGLOBIN A1C
Est. average glucose Bld gHb Est-mCnc: 134 mg/dL
Hgb A1c MFr Bld: 6.3 % — ABNORMAL HIGH (ref 4.8–5.6)

## 2016-10-03 ENCOUNTER — Other Ambulatory Visit: Payer: Self-pay

## 2016-10-03 DIAGNOSIS — G2581 Restless legs syndrome: Secondary | ICD-10-CM

## 2016-10-03 MED ORDER — GABAPENTIN 300 MG PO CAPS: ORAL_CAPSULE | ORAL | 0 refills | Status: AC

## 2016-10-03 MED ORDER — CARBIDOPA-LEVODOPA 25-250 MG PO TABS: 5.0000 | ORAL_TABLET | Freq: Every day | ORAL | 0 refills | Status: AC

## 2016-10-11 ENCOUNTER — Encounter: Payer: Self-pay | Admitting: Internal Medicine

## 2016-10-11 ENCOUNTER — Other Ambulatory Visit: Payer: Self-pay | Admitting: Internal Medicine

## 2016-10-11 ENCOUNTER — Ambulatory Visit (INDEPENDENT_AMBULATORY_CARE_PROVIDER_SITE_OTHER): Payer: Medicare Other | Admitting: Internal Medicine

## 2016-10-11 VITALS — BP 122/62 | HR 66 | Ht 70.0 in | Wt 223.0 lb

## 2016-10-11 DIAGNOSIS — Z0000 Encounter for general adult medical examination without abnormal findings: Secondary | ICD-10-CM | POA: Diagnosis not present

## 2016-10-11 DIAGNOSIS — Z23 Encounter for immunization: Secondary | ICD-10-CM | POA: Diagnosis not present

## 2016-10-11 DIAGNOSIS — E119 Type 2 diabetes mellitus without complications: Secondary | ICD-10-CM

## 2016-10-11 DIAGNOSIS — R972 Elevated prostate specific antigen [PSA]: Secondary | ICD-10-CM | POA: Diagnosis not present

## 2016-10-11 DIAGNOSIS — G2581 Restless legs syndrome: Secondary | ICD-10-CM | POA: Diagnosis not present

## 2016-10-11 DIAGNOSIS — E785 Hyperlipidemia, unspecified: Secondary | ICD-10-CM | POA: Diagnosis not present

## 2016-10-11 LAB — POCT URINALYSIS DIPSTICK
BILIRUBIN UA: NEGATIVE
GLUCOSE UA: NEGATIVE
KETONES UA: NEGATIVE
Leukocytes, UA: NEGATIVE
Nitrite, UA: NEGATIVE
Protein, UA: NEGATIVE
RBC UA: NEGATIVE
Spec Grav, UA: <=1.005
Urobilinogen, UA: 0.2
pH, UA: 6.5

## 2016-10-11 NOTE — Patient Instructions (Signed)
Health Maintenance  Topic Date Due  . TETANUS/TDAP  10/11/2026  . COLONOSCOPY  08/16/2012  . OPHTHALMOLOGY EXAM  04/15/2016  . HEMOGLOBIN A1C  12/25/2016  . FOOT EXAM  02/07/2017  . URINE MICROALBUMIN  06/10/2017  . INFLUENZA VACCINE  Completed  . Hepatitis C Screening  Addressed  . PNA vac Low Risk Adult  Completed

## 2016-10-11 NOTE — Progress Notes (Signed)
Patient: Charles Snyder, Male    DOB: Nov 08, 1945, 71 y.o.   MRN: [id number] Visit Date: 10/11/2016  Today's Provider: [name], MD   Chief Complaint  Patient presents with  . Annual Exam   Subjective:    Annual wellness visit Charles Snyder is a 71 y.o. male who presents today for his Subsequent Annual Wellness Visit. He feels well. He reports exercising regularly. He reports he is sleeping fairly well.  [facility] has now called him 70% disabled.  He and his wife are leaving soon to travel the [place] in a motor-home/camper.  They plan to eventually end up on the [facility] for a few years then return here. ----------------------------------------------------------- He does not check his blood sugar. He does not take medications for diabetes, just controls this with diet. However on review he has gained about 16 pounds and needs to work harder on diet. He is aware of this and the need for medications if A1c is much higher.   Hyperlipidemia  This is a chronic problem. The problem is controlled. Recent lipid tests were reviewed and are normal. Pertinent negatives include no chest pain, myalgias or shortness of breath. Current antihyperlipidemic treatment includes statins.  Diabetes  He presents for his follow-up diabetic visit. He has type 2 diabetes mellitus. Pertinent negatives for hypoglycemia include no dizziness or headaches. Pertinent negatives for diabetes include no chest pain and no fatigue.  Restless leg - unchanged, still disrupts sleep and evening acitivties.  He was seen by neurology but did not get NCS due to extreme nerve sensitivity.  He continues on gabapentin and sinemet.  Review of Systems  Constitutional: Negative for appetite change, chills, diaphoresis, fatigue and unexpected weight change.  HENT: Negative for hearing loss, tinnitus, trouble swallowing and voice change.   Eyes: Negative for visual disturbance.  Respiratory: Negative for choking, shortness of breath and  wheezing.   Cardiovascular: Negative for chest pain, palpitations and leg swelling.  Gastrointestinal: Negative for abdominal pain, blood in stool, constipation and diarrhea.  Genitourinary: Negative for difficulty urinating, dysuria and frequency.  Musculoskeletal: Negative for arthralgias, back pain and myalgias.  Skin: Negative for color change and rash.  Neurological: Negative for dizziness, syncope and headaches.  Hematological: Negative for adenopathy.  Psychiatric/Behavioral: Negative for dysphoric mood and sleep disturbance.    Social History   Social History  . Marital status: Married    Spouse name: N/A  . Number of children: N/A  . Years of education: N/A   Occupational History  . Not on file.   Social History Main Topics  . Smoking status: Former Smoker    Quit date: 12/14/2014  . Smokeless tobacco: Never Used  . Alcohol use 0.0 oz/week     Comment: beer or wine occasionally  . Drug use: No  . Sexual activity: Not on file   Other Topics Concern  . Not on file   Social History Narrative  . No narrative on file    Patient Active Problem List   Diagnosis Date Noted  . History of agent Orange exposure 12/07/2015  . Tremor of both hands 12/07/2015  . Paresthesia of both feet 12/07/2015  . Elevated PSA 10/08/2015  . Restless leg 03/03/2015  . Tobacco use disorder, moderate, in sustained remission 03/03/2015  . Dyslipidemia 03/03/2015  . History of colon polyps 03/03/2015  . Diabetes ([place]) 03/03/2015    Past Surgical History:  Procedure Laterality Date  . CATARACT EXTRACTION Bilateral   . CHOLECYSTECTOMY  His family history includes Diabetes in his mother; Lung cancer in his father.     Previous Medications   CARBIDOPA-LEVODOPA (SINEMET IR) 25-250 MG TABLET    Take 5 tablets by mouth daily.   GABAPENTIN (NEURONTIN) 300 MG CAPSULE    TAKE 2-4 CAPSULES BY MOUTH EVERY NIGHT AT BEDTIME   PRAVASTATIN (PRAVACHOL) 20 MG TABLET    Take 1 tablet (20 mg  total) by mouth daily.    Patient Care Team: Glean Hess, MD as PCP - General (Family Medicine)      Objective:   Vitals: BP 122/62   Pulse 66   Ht 5\' 10"  (1.778 m)   Wt 223 lb (101.2 kg)   SpO2 97%   BMI 32.00 kg/m   Physical Exam  Constitutional: He is oriented to person, place, and time. He appears well-developed and well-nourished.  HENT:  Head: Normocephalic.  Right Ear: Tympanic membrane, external ear and ear canal normal.  Left Ear: Tympanic membrane, external ear and ear canal normal.  Nose: Nose normal.  Mouth/Throat: Uvula is midline and oropharynx is clear and moist.  Eyes: Conjunctivae and EOM are normal. Pupils are equal, round, and reactive to light.  Neck: Normal range of motion. Neck supple. Carotid bruit is not present. No thyromegaly present.  Cardiovascular: Normal rate, regular rhythm, normal heart sounds and intact distal pulses.   Pulmonary/Chest: Effort normal and breath sounds normal. He has no wheezes. Right breast exhibits no mass. Left breast exhibits no mass.  Abdominal: Soft. Normal appearance and bowel sounds are normal. There is no hepatosplenomegaly. There is no tenderness.  Musculoskeletal: Normal range of motion.  Lymphadenopathy:    He has no cervical adenopathy.  Neurological: He is alert and oriented to person, place, and time. He has normal reflexes.  Skin: Skin is warm, dry and intact.  Psychiatric: He has a normal mood and affect. His speech is normal and behavior is normal. Judgment and thought content normal.  Nursing note and vitals reviewed.   Activities of Daily Living In your present state of health, do you have any difficulty performing the following activities: 10/11/2016 02/08/2016  Hearing? Y N  Vision? N N  Difficulty concentrating or making decisions? N N  Walking or climbing stairs? N N  Dressing or bathing? N N  Doing errands, shopping? N N  Preparing Food and eating ? N -  Using the Toilet? N -  In the past six  months, have you accidently leaked urine? N -  Do you have problems with loss of bowel control? N -  Managing your Medications? N -  Managing your Finances? N -  Housekeeping or managing your Housekeeping? N -  Some recent data might be hidden    Fall Risk Assessment Fall Risk  10/11/2016 02/08/2016 10/08/2015  Falls in the past year? No No No      Depression Screen PHQ 2/9 Scores 10/11/2016 02/08/2016 10/08/2015  PHQ - 2 Score 0 0 0   6CIT Screen 10/11/2016  What Year? 0 points  What month? 0 points  What time? 0 points  Count back from 20 0 points  Months in reverse 0 points  Repeat phrase 2 points  Total Score 2    Medicare Annual Wellness Visit Summary:  Reviewed patient's Family Medical History Reviewed and updated list of patient's medical providers Assessment of cognitive impairment was done Assessed patient's functional ability Established a written schedule for health screening [place] Completed and Reviewed  Exercise  Activities and Dietary recommendations Goals    None      Immunization History  Administered Date(s) Administered  . Influenza,inj,Quad PF,36+ Mos 06/15/2015, 06/10/2016  . Pneumococcal Conjugate-13 08/26/2014  . Pneumococcal Polysaccharide-23 05/01/2013    Health Maintenance  Topic Date Due  . TETANUS/TDAP  04/25/1967  . COLONOSCOPY  08/16/2012  . OPHTHALMOLOGY EXAM  04/15/2016  . HEMOGLOBIN A1C  12/25/2016  . FOOT EXAM  02/07/2017  . URINE MICROALBUMIN  06/10/2017  . INFLUENZA VACCINE  Completed  . Hepatitis C Screening  Addressed  . PNA vac Low Risk Adult  Completed    Discussed health benefits of physical activity, and encouraged him to engage in regular exercise appropriate for his age and condition.    ------------------------------------------------------------------------------------------------------------  Assessment & Plan:  1. Medicare annual wellness visit, subsequent Measures satisfied - POCT  urinalysis dipstick  2. Type 2 diabetes mellitus without complication, without long-term current use of insulin (HCC) Encouraged better diet and weight loss - Comprehensive metabolic panel - Hemoglobin A1c - Microalbumin / creatinine urine ratio  3. Restless leg Fair control - CBC with Differential/Platelet  4. Dyslipidemia On statin therapy - Lipid panel  5. Elevated PSA Recheck labs; previous bx negative 2016;per patient "he will never have another bx" - PSA  6. Need for immunization against tetanus alone - Tdap vaccine greater than or equal to 7yo IM    [name], MD [facility]  10/11/2016

## 2016-10-12 LAB — CBC WITH DIFFERENTIAL/PLATELET
BASOS ABS: 0 10*3/uL (ref 0.0–0.2)
BASOS: 1 %
EOS (ABSOLUTE): 0.1 10*3/uL (ref 0.0–0.4)
Eos: 2 %
Hematocrit: 42.9 % (ref 37.5–51.0)
Hemoglobin: 15.1 g/dL (ref 13.0–17.7)
IMMATURE GRANS (ABS): 0 10*3/uL (ref 0.0–0.1)
Immature Granulocytes: 0 %
LYMPHS: 33 %
Lymphocytes Absolute: 2.2 10*3/uL (ref 0.7–3.1)
MCH: 33.3 pg — AB (ref 26.6–33.0)
MCHC: 35.2 g/dL (ref 31.5–35.7)
MCV: 95 fL (ref 79–97)
MONOS ABS: 0.5 10*3/uL (ref 0.1–0.9)
Monocytes: 7 %
NEUTROS ABS: 3.9 10*3/uL (ref 1.4–7.0)
NEUTROS PCT: 57 %
PLATELETS: 230 10*3/uL (ref 150–379)
RBC: 4.53 x10E6/uL (ref 4.14–5.80)
RDW: 13.5 % (ref 12.3–15.4)
WBC: 6.7 10*3/uL (ref 3.4–10.8)

## 2016-10-12 LAB — COMPREHENSIVE METABOLIC PANEL
A/G RATIO: 1.6 (ref 1.2–2.2)
ALK PHOS: 82 IU/L (ref 39–117)
ALT: 30 IU/L (ref 0–44)
AST: 19 IU/L (ref 0–40)
Albumin: 4.2 g/dL (ref 3.6–4.8)
BILIRUBIN TOTAL: 0.4 mg/dL (ref 0.0–1.2)
BUN/Creatinine Ratio: 14 (ref 10–24)
BUN: 12 mg/dL (ref 8–27)
CHLORIDE: 101 mmol/L (ref 96–106)
CO2: 24 mmol/L (ref 18–29)
Calcium: 9.3 mg/dL (ref 8.6–10.2)
Creatinine, Ser: 0.88 mg/dL (ref 0.76–1.27)
GFR calc non Af Amer: 88 mL/min/{1.73_m2} (ref >59)
GFR, EST AFRICAN AMERICAN: 102 mL/min/{1.73_m2} (ref >59)
GLUCOSE: 119 mg/dL — AB (ref 65–99)
Globulin, Total: 2.6 g/dL (ref 1.5–4.5)
POTASSIUM: 4.1 mmol/L (ref 3.5–5.2)
Sodium: 141 mmol/L (ref 134–144)
Total Protein: 6.8 g/dL (ref 6.0–8.5)

## 2016-10-12 LAB — LIPID PANEL
CHOL/HDL RATIO: 6.3 ratio — AB (ref 0.0–5.0)
Cholesterol, Total: 225 mg/dL — ABNORMAL HIGH (ref 100–199)
HDL: 36 mg/dL — AB (ref >39)
LDL Calculated: 114 mg/dL — ABNORMAL HIGH (ref 0–99)
Triglycerides: 377 mg/dL — ABNORMAL HIGH (ref 0–149)
VLDL CHOLESTEROL CAL: 75 mg/dL — AB (ref 5–40)

## 2016-10-12 LAB — HEMOGLOBIN A1C
ESTIMATED AVERAGE GLUCOSE: 143 mg/dL
HEMOGLOBIN A1C: 6.6 % — AB (ref 4.8–5.6)

## 2016-10-12 LAB — PSA: PROSTATE SPECIFIC AG, SERUM: 4.4 ng/mL — AB (ref 0.0–4.0)

## 2016-10-13 DIAGNOSIS — R300 Dysuria: Secondary | ICD-10-CM | POA: Diagnosis not present

## 2016-10-13 DIAGNOSIS — D309 Benign neoplasm of urinary organ, unspecified: Secondary | ICD-10-CM | POA: Diagnosis not present

## 2016-10-14 LAB — MICROALBUMIN / CREATININE URINE RATIO
Creatinine, Urine: 19.2 mg/dL
Microalb/Creat Ratio: <15.6 mg/g creat (ref 0.0–30.0)

## 2016-10-14 LAB — PLEASE NOTE

## 2016-10-27 DIAGNOSIS — H26493 Other secondary cataract, bilateral: Secondary | ICD-10-CM | POA: Diagnosis not present

## 2017-05-26 DIAGNOSIS — Z122 Encounter for screening for malignant neoplasm of respiratory organs: Secondary | ICD-10-CM | POA: Diagnosis not present

## 2017-05-26 DIAGNOSIS — G2581 Restless legs syndrome: Secondary | ICD-10-CM | POA: Diagnosis not present

## 2017-05-26 DIAGNOSIS — Z87891 Personal history of nicotine dependence: Secondary | ICD-10-CM | POA: Diagnosis not present

## 2017-05-26 DIAGNOSIS — R739 Hyperglycemia, unspecified: Secondary | ICD-10-CM | POA: Diagnosis not present

## 2017-05-26 DIAGNOSIS — Z6829 Body mass index (BMI) 29.0-29.9, adult: Secondary | ICD-10-CM | POA: Diagnosis not present

## 2017-08-28 DIAGNOSIS — G2581 Restless legs syndrome: Secondary | ICD-10-CM | POA: Diagnosis not present

## 2017-08-28 DIAGNOSIS — G629 Polyneuropathy, unspecified: Secondary | ICD-10-CM | POA: Diagnosis not present

## 2017-08-28 DIAGNOSIS — R7303 Prediabetes: Secondary | ICD-10-CM | POA: Diagnosis not present

## 2017-08-28 DIAGNOSIS — Z7689 Persons encountering health services in other specified circumstances: Secondary | ICD-10-CM | POA: Diagnosis not present

## 2017-10-04 ENCOUNTER — Telehealth: Payer: Self-pay

## 2017-10-04 NOTE — Telephone Encounter (Signed)
Called pt to sched AWV w/ [facility]. Last AWV was 10/11/16. Pt can be scheduled anytime after 10/02/17. LVM requesting returned call.

## 2017-10-04 NOTE — Telephone Encounter (Signed)
Pt returned call stating he and his wife have purchased an RV an are traveling. Pt is no longer established with a PCP.

## 2017-10-11 ENCOUNTER — Telehealth: Payer: Self-pay

## 2017-10-11 NOTE — Telephone Encounter (Signed)
Patient and wife have moved away and are traveling the country in an RV.  They are no longer patient in this practice.  His wife's name is Eldridge Marcott.

## 2017-10-11 NOTE — Telephone Encounter (Signed)
Called pt to sched AWV w/ [facility]. Unable to reach pt d/t # being disconnected

## 2017-10-27 IMAGING — CR DG LUMBAR SPINE COMPLETE 4+V
5 series · 5 of 5 positions shown · non-contrast
Comparison: No recent prior.

CLINICAL DATA: Numbness in both feet.

EXAM:
LUMBAR SPINE - COMPLETE 4+ VIEW

[l-spine ap]
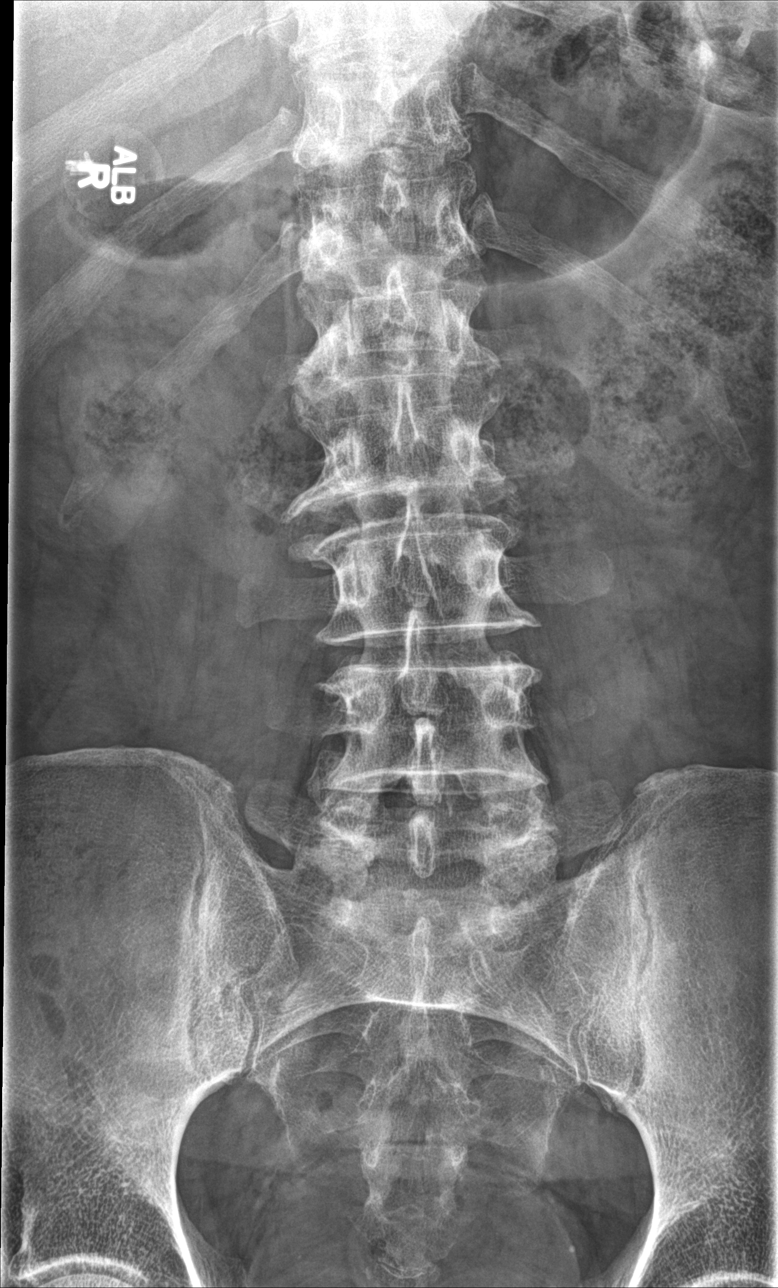

[l-spine obl (1 of 2)]
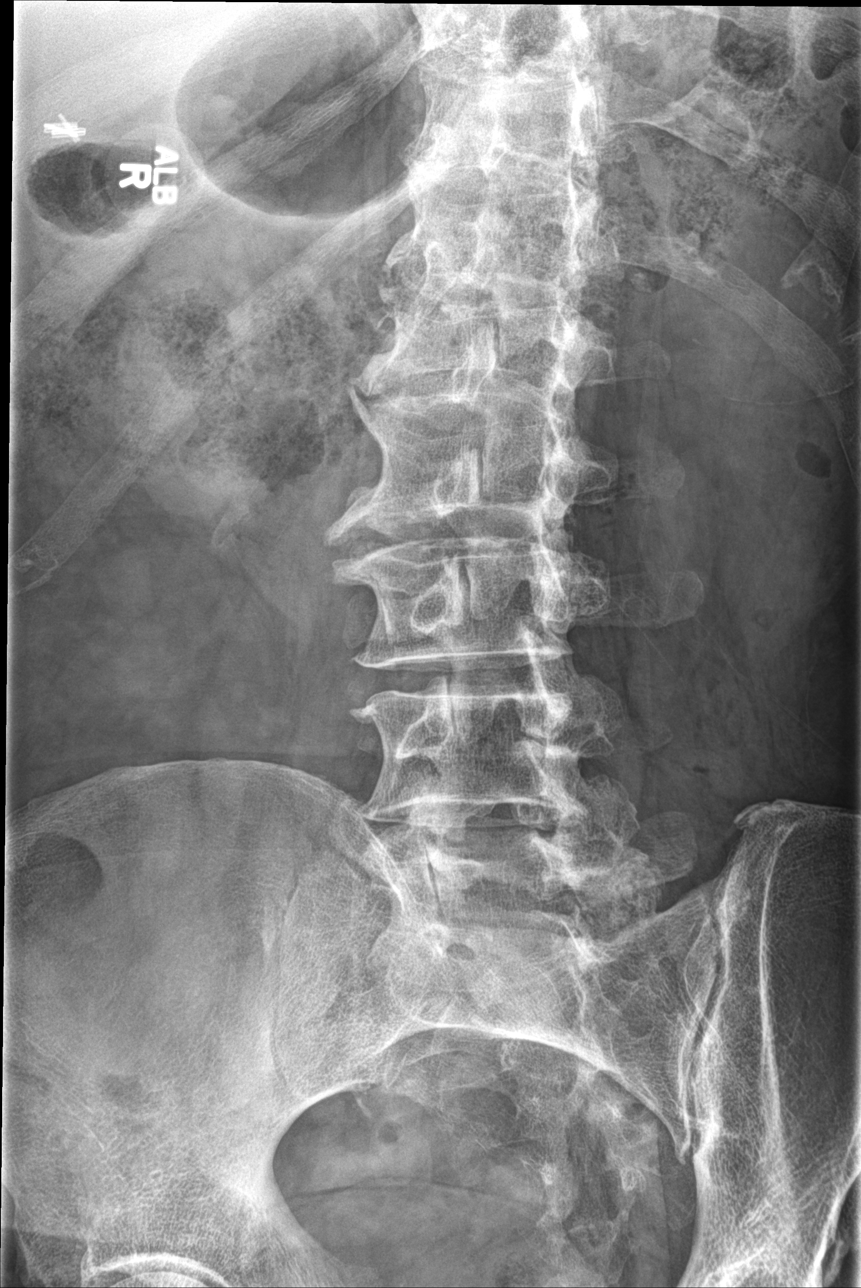

[l-spine obl (2 of 2)]
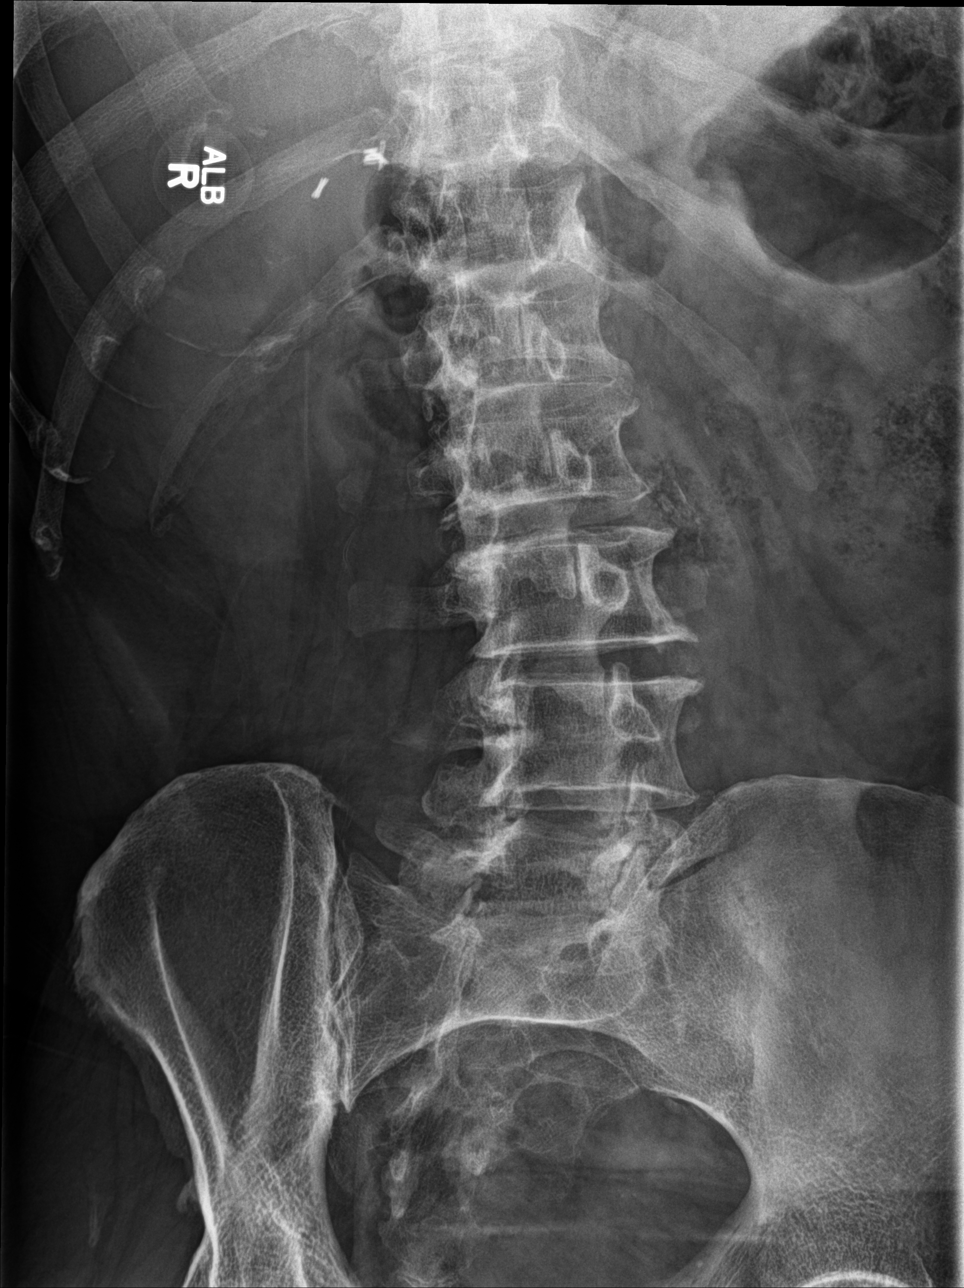

[l-spine lat]
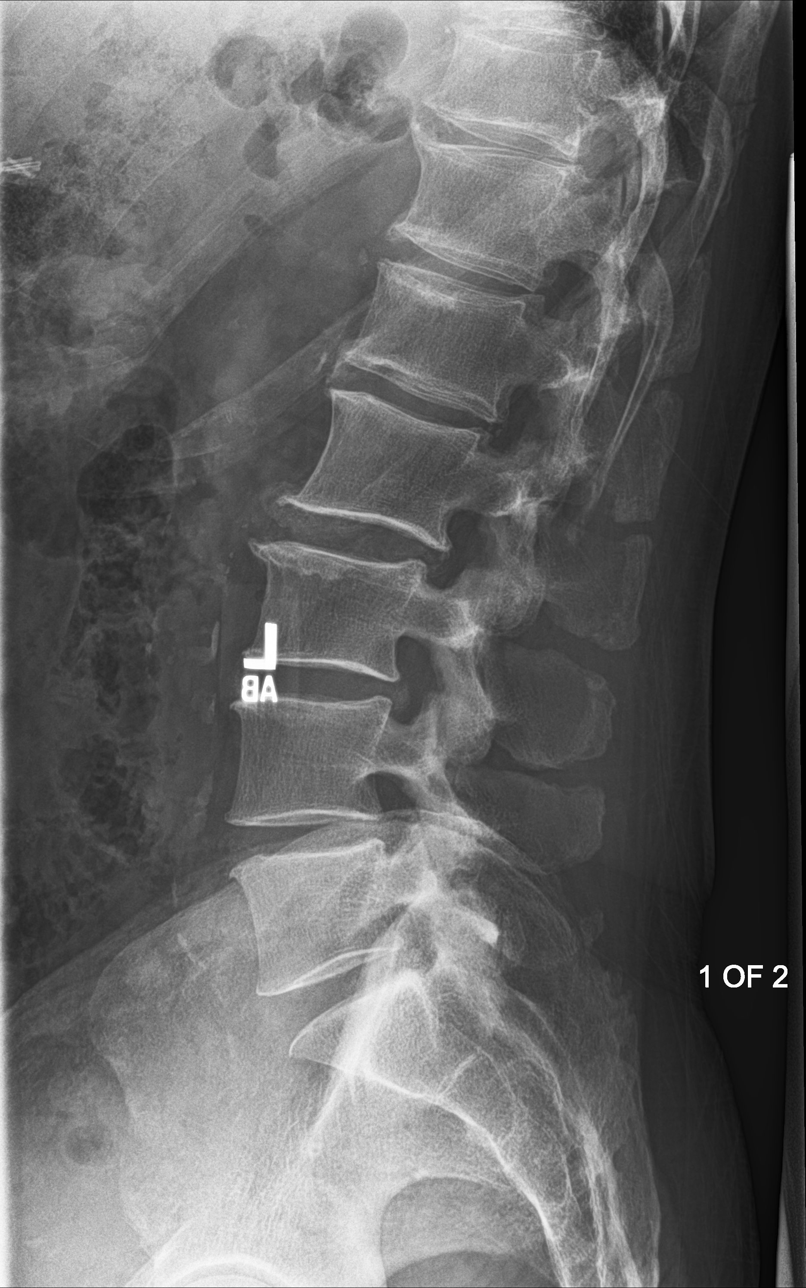

[l-spine spot]
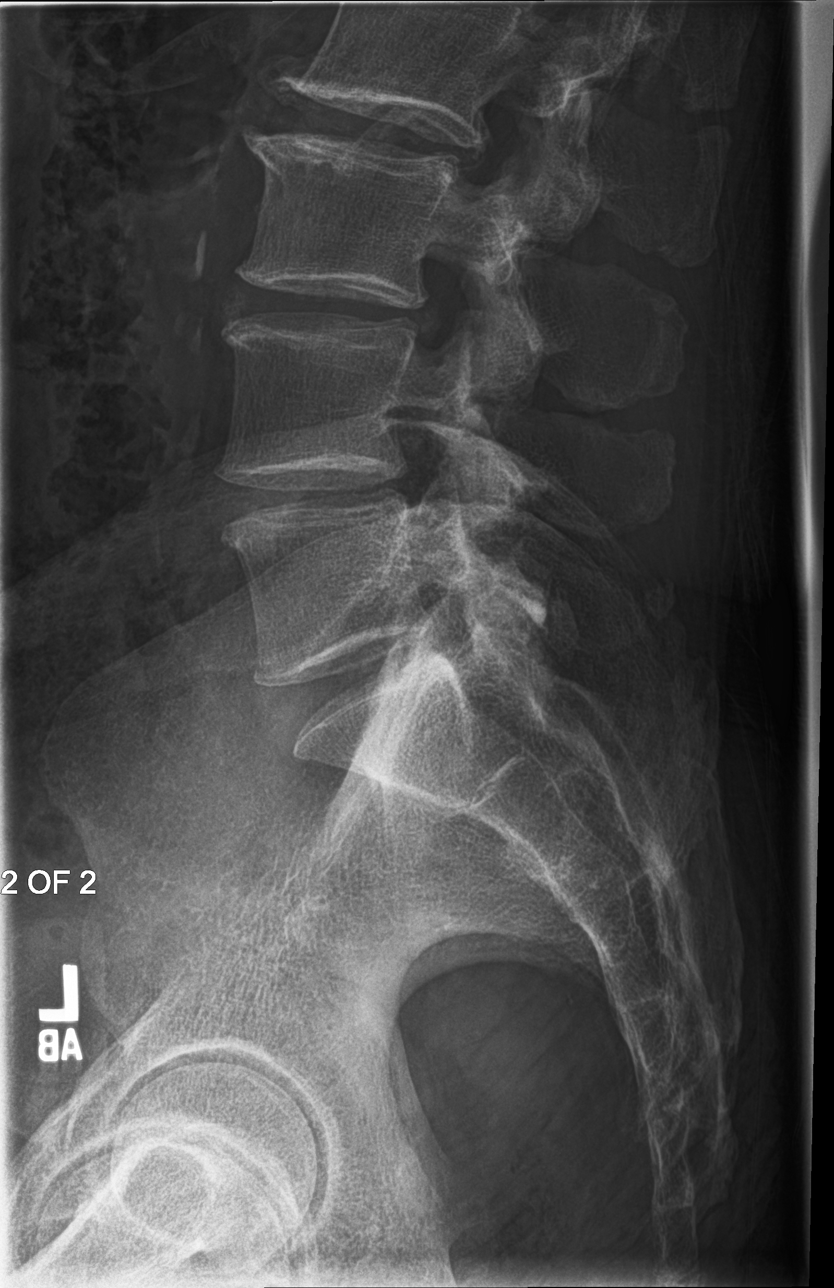

[5 of 5 positions shown; findings below may reference images not displayed]

FINDINGS: Diffuse multilevel degenerative change. Mild scoliosis concave
right.No acute bony abnormality identified. Aortoiliac
atherosclerotic vascular calcification.
IMPRESSION: 1. Diffuse multilevel degenerative change. Mild scoliosis concave
right. No acute abnormality.

2. Aortoiliac atherosclerotic vascular disease.

## 2017-11-04 DIAGNOSIS — Z760 Encounter for issue of repeat prescription: Secondary | ICD-10-CM | POA: Diagnosis not present

## 2017-11-04 DIAGNOSIS — Z87891 Personal history of nicotine dependence: Secondary | ICD-10-CM | POA: Diagnosis not present

## 2017-11-04 DIAGNOSIS — G629 Polyneuropathy, unspecified: Secondary | ICD-10-CM | POA: Diagnosis not present

## 2018-03-13 DIAGNOSIS — G4700 Insomnia, unspecified: Secondary | ICD-10-CM | POA: Diagnosis not present

## 2018-03-13 DIAGNOSIS — E119 Type 2 diabetes mellitus without complications: Secondary | ICD-10-CM | POA: Diagnosis not present

## 2018-03-13 DIAGNOSIS — G2581 Restless legs syndrome: Secondary | ICD-10-CM | POA: Diagnosis not present

## 2018-03-13 DIAGNOSIS — E1142 Type 2 diabetes mellitus with diabetic polyneuropathy: Secondary | ICD-10-CM | POA: Diagnosis not present

## 2018-07-19 DIAGNOSIS — G2581 Restless legs syndrome: Secondary | ICD-10-CM | POA: Diagnosis not present

## 2018-07-19 DIAGNOSIS — F5104 Psychophysiologic insomnia: Secondary | ICD-10-CM | POA: Diagnosis not present

## 2018-07-19 DIAGNOSIS — E1142 Type 2 diabetes mellitus with diabetic polyneuropathy: Secondary | ICD-10-CM | POA: Diagnosis not present

## 2019-03-13 ENCOUNTER — Other Ambulatory Visit: Payer: Self-pay

## 2019-05-29 DIAGNOSIS — E785 Hyperlipidemia, unspecified: Secondary | ICD-10-CM | POA: Diagnosis not present

## 2019-05-29 DIAGNOSIS — Z23 Encounter for immunization: Secondary | ICD-10-CM | POA: Diagnosis not present

## 2019-05-29 DIAGNOSIS — G2581 Restless legs syndrome: Secondary | ICD-10-CM | POA: Diagnosis not present

## 2019-05-29 DIAGNOSIS — E119 Type 2 diabetes mellitus without complications: Secondary | ICD-10-CM | POA: Diagnosis not present

## 2019-05-29 DIAGNOSIS — F4310 Post-traumatic stress disorder, unspecified: Secondary | ICD-10-CM | POA: Diagnosis not present

## 2019-05-31 DIAGNOSIS — E785 Hyperlipidemia, unspecified: Secondary | ICD-10-CM | POA: Diagnosis not present

## 2019-05-31 DIAGNOSIS — G629 Polyneuropathy, unspecified: Secondary | ICD-10-CM | POA: Diagnosis not present

## 2019-05-31 DIAGNOSIS — Z125 Encounter for screening for malignant neoplasm of prostate: Secondary | ICD-10-CM | POA: Diagnosis not present

## 2019-05-31 DIAGNOSIS — E119 Type 2 diabetes mellitus without complications: Secondary | ICD-10-CM | POA: Diagnosis not present

## 2019-06-04 DIAGNOSIS — L989 Disorder of the skin and subcutaneous tissue, unspecified: Secondary | ICD-10-CM | POA: Diagnosis not present

## 2019-06-04 DIAGNOSIS — E785 Hyperlipidemia, unspecified: Secondary | ICD-10-CM | POA: Diagnosis not present

## 2019-06-04 DIAGNOSIS — G629 Polyneuropathy, unspecified: Secondary | ICD-10-CM | POA: Diagnosis not present

## 2019-06-04 DIAGNOSIS — E119 Type 2 diabetes mellitus without complications: Secondary | ICD-10-CM | POA: Diagnosis not present
